# Patient Record
Sex: Male | Born: 1970 | Race: White | Hispanic: No | Marital: Married | Smoking: Never smoker
Health system: Southern US, Academic
[De-identification: ages and names within clinical notes are randomized; demographics above are authoritative.]

## PROBLEM LIST (undated history)

## (undated) DIAGNOSIS — R12 Heartburn: Secondary | ICD-10-CM

## (undated) DIAGNOSIS — L4050 Arthropathic psoriasis, unspecified: Secondary | ICD-10-CM

## (undated) HISTORY — PX: HX VASECTOMY: SHX75

## (undated) HISTORY — PX: WRIST SURGERY: SHX841

---

## 2001-04-13 ENCOUNTER — Emergency Department (HOSPITAL_COMMUNITY): Payer: Self-pay

## 2021-10-18 ENCOUNTER — Other Ambulatory Visit (HOSPITAL_COMMUNITY): Payer: Self-pay | Admitting: NURSE PRACTITIONER

## 2021-10-18 DIAGNOSIS — R109 Unspecified abdominal pain: Secondary | ICD-10-CM

## 2021-10-25 ENCOUNTER — Other Ambulatory Visit (HOSPITAL_COMMUNITY): Payer: Self-pay | Admitting: NURSE PRACTITIONER

## 2021-10-25 DIAGNOSIS — M25511 Pain in right shoulder: Secondary | ICD-10-CM

## 2021-12-30 ENCOUNTER — Ambulatory Visit (HOSPITAL_COMMUNITY): Payer: Self-pay

## 2022-01-17 ENCOUNTER — Other Ambulatory Visit (HOSPITAL_COMMUNITY): Payer: BC Managed Care – PPO

## 2022-02-28 ENCOUNTER — Ambulatory Visit (HOSPITAL_COMMUNITY): Payer: Self-pay

## 2022-04-06 IMAGING — MR MRI SHOULDER RT W/O CONTRAST
6 series · 39 of 40 positions shown · non-contrast
Comparison: None available.

﻿EXAM:  [id number]   MRI SHOULDER RT W/O CONTRAST
INDICATION: Right shoulder pain, limited range of motion.
TECHNIQUE: Noncontrast multiplanar, multisequence MRI was performed.

[Series 6: T1 · oblique · right · 3.5mm · 0.33mm/px · 7 of 18 slices shown]
[im 1/18]
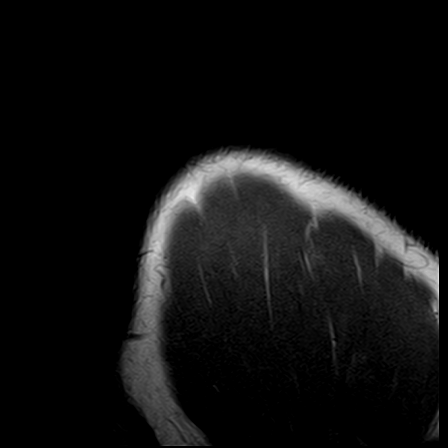
[im 3/18]
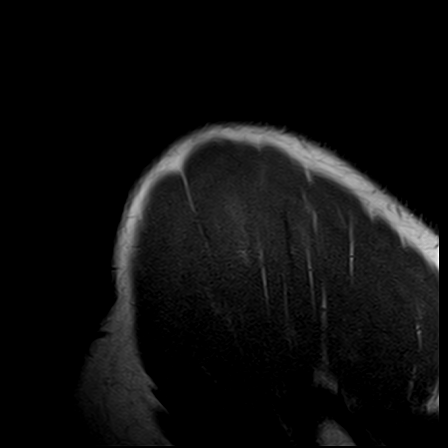
[im 6/18]
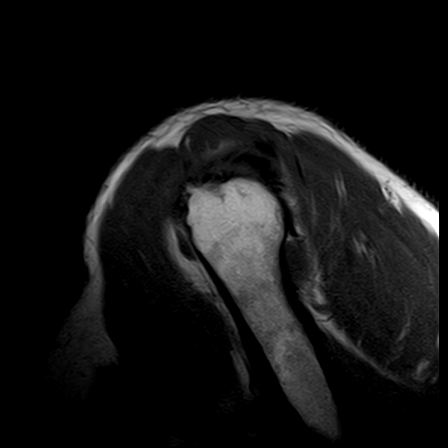
[im 9/18]
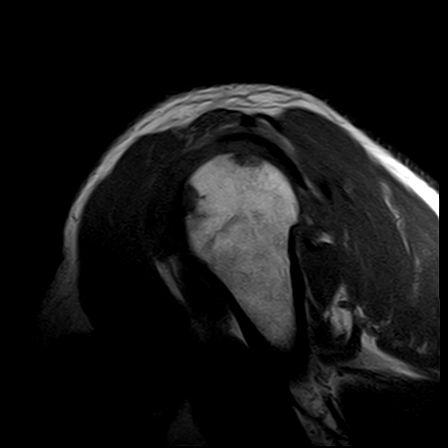
[im 12/18]
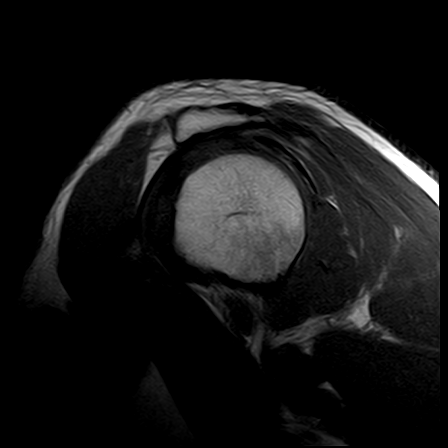
[im 15/18]
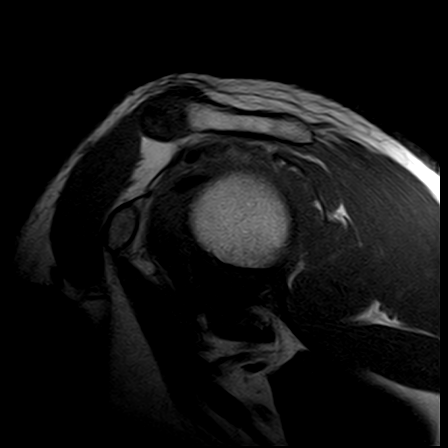
[im 18/18]
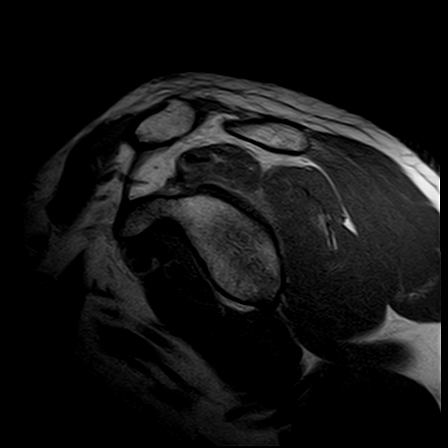

[Series 7: STIR · oblique · right · 3.5mm · 0.47mm/px · 7 of 18 slices shown (1 of 2)]
[im 1/18]
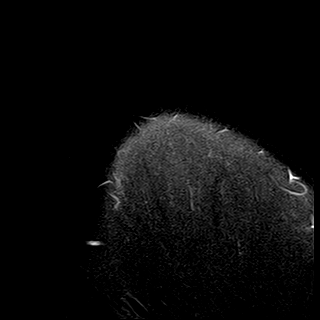
[im 3/18]
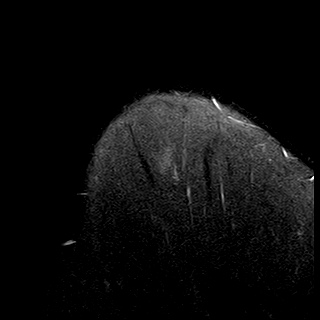
[im 6/18]
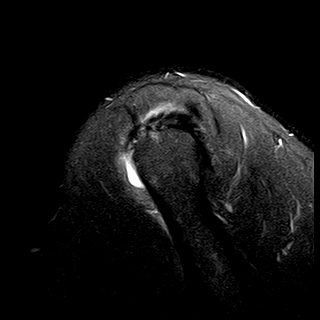
[im 9/18]
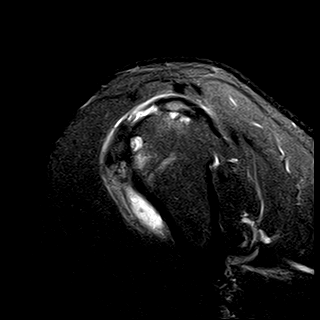
[im 12/18]
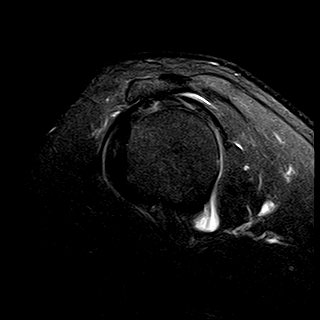
[im 15/18]
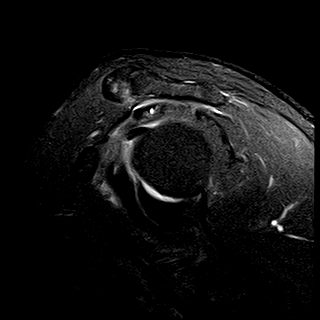
[im 18/18]
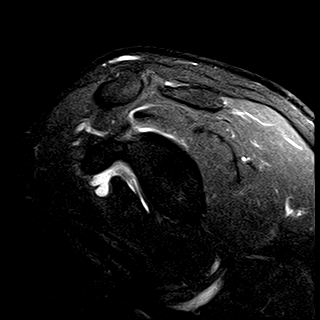

[Series 8: PD fat-sat · axial · right · 4.0mm · 0.50mm/px · z∈[+10,+86]mm · 6 of 18 slices shown (1 of 2)]
[im 1/18]
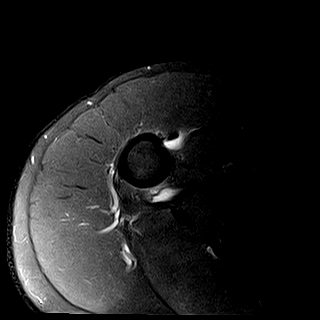
[im 4/18]
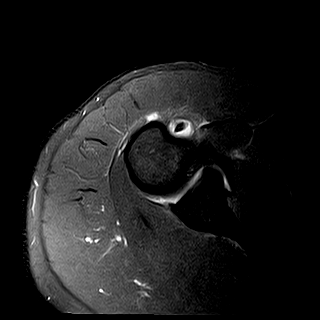
[im 7/18]
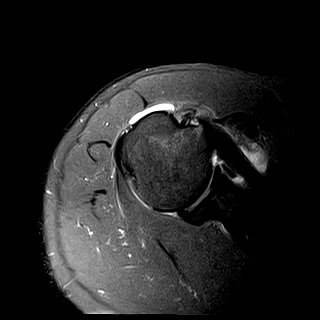
[im 11/18]
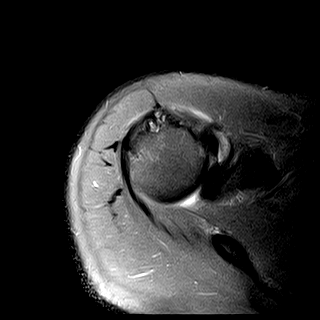
[im 14/18]
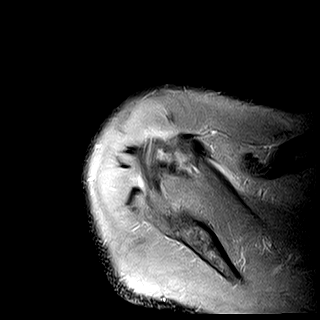
[im 18/18]
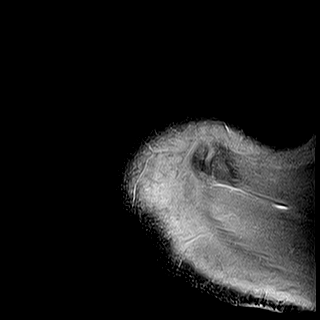

[Series 9: T2 fat-sat · axial · right · 4.0mm · 0.42mm/px · z∈[-3,+99]mm · 8 of 24 slices shown]
[im 1/24]
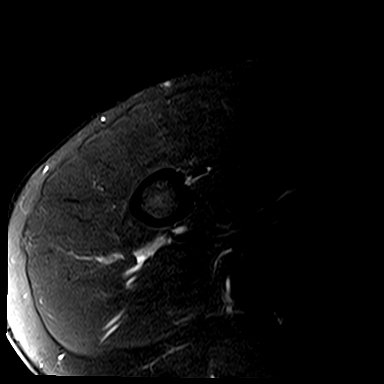
[im 4/24]
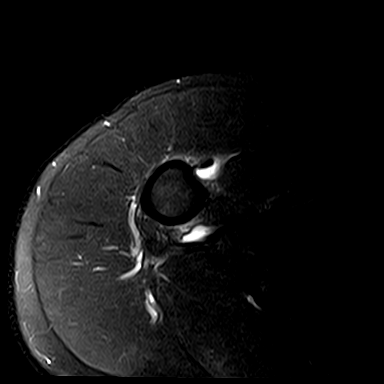
[im 7/24]
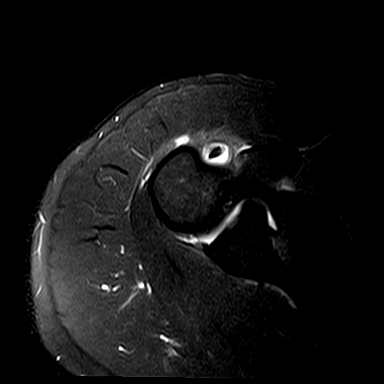
[im 10/24]
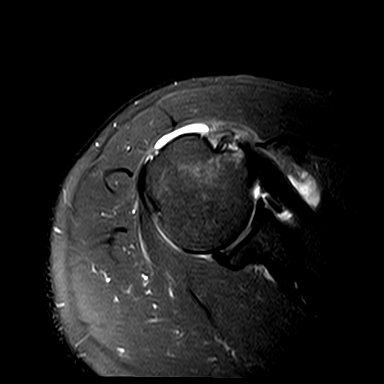
[im 14/24]
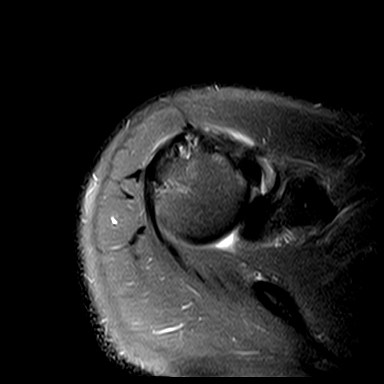
[im 17/24]
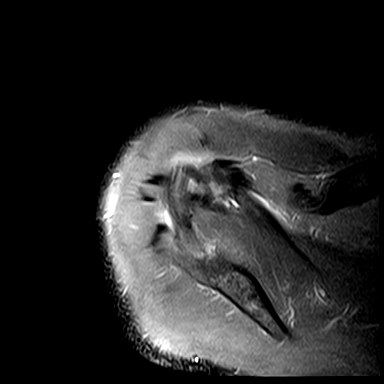
[im 20/24]
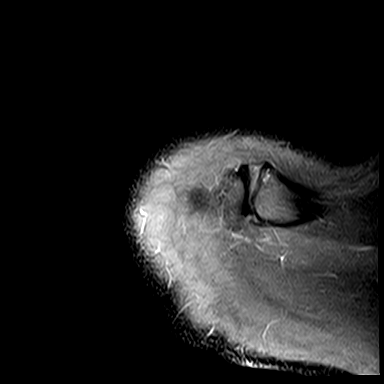
[im 24/24]
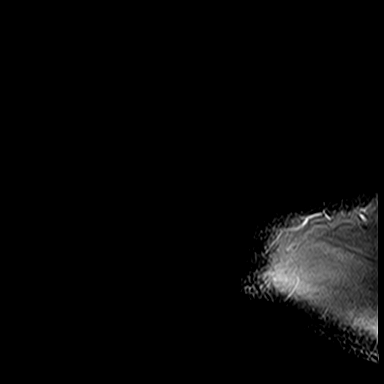

[Series 10: PD fat-sat · oblique · right · 3.5mm · 0.47mm/px · 6 of 18 slices shown (2 of 2)]
[im 1/18]
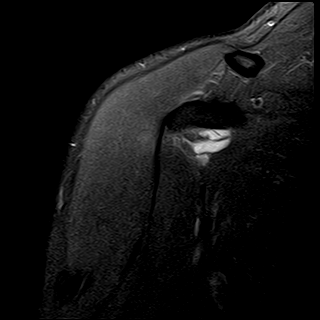
[im 4/18]
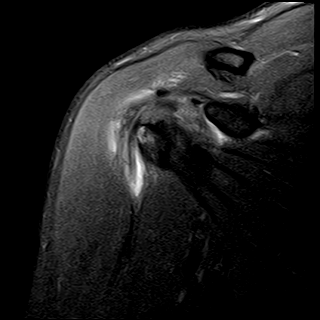
[im 7/18]
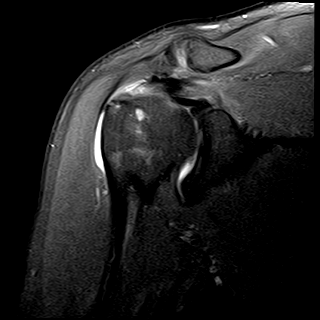
[im 11/18]
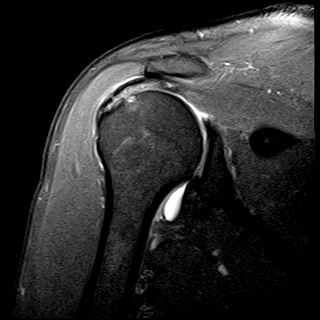
[im 14/18]
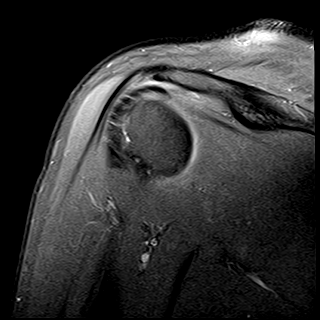
[im 18/18]
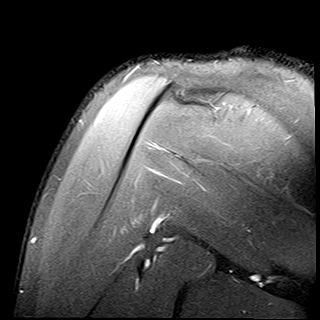

[Series 11: STIR · oblique · right · 3.5mm · 0.47mm/px · 5 of 18 slices shown (2 of 2)]
[im 1/18]
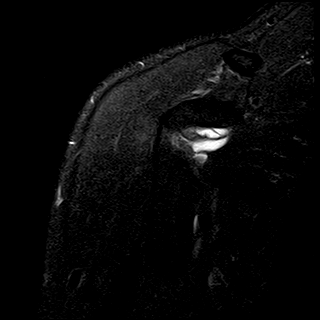
[im 4/18]
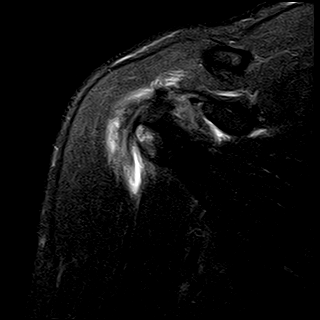
[im 7/18]
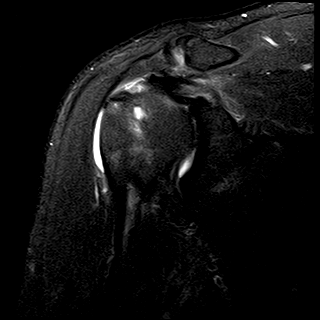
[im 11/18]
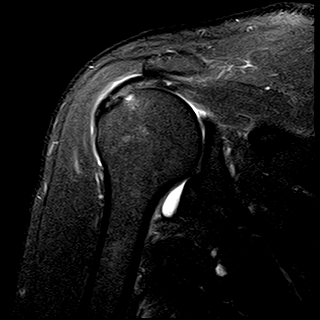
[im 14/18]
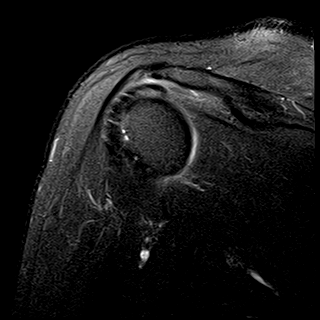

[39 of 40 positions shown; findings below may reference images not displayed]

FINDINGS: There is a small amount of fluid in the glenohumeral joint and in the subacromial-subdeltoid bursa.  Degenerative cysts are noted adjacent to the subscapularis and supraspinatus tendon insertions.  

There is a large partial tear involving the infraspinatus tendon.  There is also a full thickness tear of the supraspinatus tendon.  There is a partial tear of the subscapularis tendon.  

There are mild degenerative changes involving the acromioclavicular joint with a slightly down sloping acromion with impingement on the rotator cuff.  

The glenoid labrum appears intact.  There is no fracture or dislocation.  The biceps tendon is intact.
IMPRESSION: Rotator cuff tear.

## 2022-06-19 ENCOUNTER — Inpatient Hospital Stay (HOSPITAL_BASED_OUTPATIENT_CLINIC_OR_DEPARTMENT_OTHER)
Admission: RE | Admit: 2022-06-19 | Discharge: 2022-06-19 | Disposition: A | Discharge status: Home / Self Care | Payer: BC Managed Care – PPO | Source: Ambulatory Visit | Attending: Orthopaedic Surgery | Admitting: Orthopaedic Surgery

## 2022-06-19 ENCOUNTER — Other Ambulatory Visit (HOSPITAL_COMMUNITY): Payer: Self-pay | Admitting: Orthopaedic Surgery

## 2022-06-19 ENCOUNTER — Other Ambulatory Visit: Payer: BC Managed Care – PPO | Attending: Orthopaedic Surgery

## 2022-06-19 ENCOUNTER — Other Ambulatory Visit: Payer: Self-pay

## 2022-06-19 DIAGNOSIS — Z01818 Encounter for other preprocedural examination: Secondary | ICD-10-CM

## 2022-06-19 LAB — URINALYSIS, MACROSCOPIC
BILIRUBIN: NEGATIVE mg/dL
BLOOD: NEGATIVE mg/dL
GLUCOSE: NEGATIVE mg/dL
KETONES: NEGATIVE mg/dL
LEUKOCYTES: NEGATIVE WBCs/uL
NITRITE: NEGATIVE
PH: 5.5 (ref 5.0–9.0)
PROTEIN: NEGATIVE mg/dL
SPECIFIC GRAVITY: 1.009 (ref 1.002–1.030)
UROBILINOGEN: NORMAL mg/dL

## 2022-06-19 LAB — CBC WITH DIFF
BASOPHIL #: 0.1 10*3/uL (ref 0.00–0.10)
BASOPHIL %: 1 % (ref 0–1)
EOSINOPHIL #: 0.4 10*3/uL (ref 0.00–0.50)
EOSINOPHIL %: 5 %
HCT: 44 % (ref 36.7–47.1)
HGB: 15.4 g/dL (ref 12.5–16.3)
LYMPHOCYTE #: 2.9 10*3/uL (ref 1.00–3.00)
LYMPHOCYTE %: 35 % (ref 16–44)
MCH: 30.7 pg (ref 23.8–33.4)
MCHC: 35.1 g/dL (ref 32.5–36.3)
MCV: 87.6 fL (ref 73.0–96.2)
MONOCYTE #: 0.7 10*3/uL (ref 0.30–1.00)
MONOCYTE %: 9 % (ref 5–13)
MPV: 8.3 fL (ref 7.4–11.4)
NEUTROPHIL #: 4.3 10*3/uL (ref 1.85–7.80)
NEUTROPHIL %: 51 % (ref 43–77)
PLATELETS: 295 10*3/uL (ref 140–440)
RBC: 5.02 10*6/uL (ref 4.06–5.63)
RDW: 12.4 % (ref 12.1–16.2)
WBC: 8.4 10*3/uL (ref 3.6–10.2)

## 2022-06-19 LAB — BASIC METABOLIC PANEL
ANION GAP: 8 mmol/L (ref 4–13)
BUN/CREA RATIO: 13 (ref 6–22)
BUN: 11 mg/dL (ref 7–25)
CALCIUM: 9.6 mg/dL (ref 8.6–10.3)
CHLORIDE: 105 mmol/L (ref 98–107)
CO2 TOTAL: 26 mmol/L (ref 21–31)
CREATININE: 0.84 mg/dL (ref 0.60–1.30)
ESTIMATED GFR: 106 mL/min/{1.73_m2} (ref >59)
GLUCOSE: 76 mg/dL (ref 74–109)
OSMOLALITY, CALCULATED: 276 mOsm/kg (ref 270–290)
POTASSIUM: 3.8 mmol/L (ref 3.5–5.1)
SODIUM: 139 mmol/L (ref 136–145)

## 2022-06-19 LAB — ECG 12 LEAD
Atrial Rate: 70 {beats}/min
Calculated P Axis: 63 degrees
Calculated R Axis: 54 degrees
Calculated T Axis: 44 degrees
PR Interval: 160 ms
QRS Duration: 90 ms
QT Interval: 374 ms
QTC Calculation: 403 ms
Ventricular rate: 70 {beats}/min

## 2022-06-19 LAB — URINALYSIS, MICROSCOPIC: HYALINE CASTS: 1 /lpf — ABNORMAL HIGH (ref <0)

## 2022-07-04 ENCOUNTER — Ambulatory Visit (HOSPITAL_COMMUNITY): Payer: BC Managed Care – PPO | Admitting: Anesthesiology

## 2022-07-04 ENCOUNTER — Encounter (HOSPITAL_COMMUNITY): Payer: BC Managed Care – PPO | Admitting: Orthopaedic Surgery

## 2022-07-04 ENCOUNTER — Encounter (HOSPITAL_COMMUNITY): Payer: Self-pay | Admitting: Orthopaedic Surgery

## 2022-07-04 ENCOUNTER — Inpatient Hospital Stay
Admission: RE | Admit: 2022-07-04 | Discharge: 2022-07-04 | Disposition: A | Discharge status: Home / Self Care | Payer: BC Managed Care – PPO | Source: Ambulatory Visit | Attending: Orthopaedic Surgery | Admitting: Orthopaedic Surgery

## 2022-07-04 ENCOUNTER — Other Ambulatory Visit: Payer: Self-pay

## 2022-07-04 ENCOUNTER — Encounter (HOSPITAL_COMMUNITY)
Admission: RE | Disposition: A | Discharge status: Home / Self Care | Payer: Self-pay | Source: Ambulatory Visit | Attending: Orthopaedic Surgery

## 2022-07-04 DIAGNOSIS — K219 Gastro-esophageal reflux disease without esophagitis: Secondary | ICD-10-CM | POA: Insufficient documentation

## 2022-07-04 DIAGNOSIS — M75121 Complete rotator cuff tear or rupture of right shoulder, not specified as traumatic: Secondary | ICD-10-CM | POA: Insufficient documentation

## 2022-07-04 HISTORY — DX: Arthropathic psoriasis, unspecified (CMS HCC): L40.50

## 2022-07-04 HISTORY — DX: Heartburn: R12

## 2022-07-04 SURGERY — ARTHROSCOPY SHOULDER
Anesthesia: General | Site: Shoulder | Laterality: Right | Wound class: Clean Wound: Uninfected operative wounds in which no inflammation occurred

## 2022-07-04 MED ORDER — FAMOTIDINE (PF) 20 MG/2 ML INTRAVENOUS SOLUTION
INTRAVENOUS | Status: AC
Start: 2022-07-04 — End: 2022-07-04
  Filled 2022-07-04: qty 2

## 2022-07-04 MED ORDER — LACTATED RINGERS INTRAVENOUS SOLUTION
INTRAVENOUS | Status: DC
Start: 2022-07-04 — End: 2022-07-04

## 2022-07-04 MED ORDER — SODIUM CHLORIDE 0.9 % (FLUSH) INJECTION SYRINGE
3.0000 mL | INJECTION | Freq: Three times a day (TID) | INTRAMUSCULAR | Status: DC
Start: 2022-07-04 — End: 2022-07-04

## 2022-07-04 MED ORDER — MIDAZOLAM 5 MG/ML INJECTION WRAPPER
INTRAMUSCULAR | Status: AC
Start: 2022-07-04 — End: 2022-07-04
  Filled 2022-07-04: qty 1

## 2022-07-04 MED ORDER — SODIUM CHLORIDE 0.9 % INTRAVENOUS PIGGYBACK
INJECTION | INTRAVENOUS | Status: AC
Start: 2022-07-04 — End: 2022-07-04
  Filled 2022-07-04: qty 100

## 2022-07-04 MED ORDER — OXYCODONE-ACETAMINOPHEN 5 MG-325 MG TABLET
1.0000 | ORAL_TABLET | Freq: Once | ORAL | Status: DC | PRN
Start: 2022-07-04 — End: 2022-07-04

## 2022-07-04 MED ORDER — ONDANSETRON HCL (PF) 4 MG/2 ML INJECTION SOLUTION
4.0000 mg | Freq: Once | INTRAMUSCULAR | Status: AC
Start: 2022-07-04 — End: 2022-07-04
  Administered 2022-07-04: 4 mg via INTRAVENOUS

## 2022-07-04 MED ORDER — HYDROCODONE 7.5 MG-ACETAMINOPHEN 325 MG TABLET
1.0000 | ORAL_TABLET | ORAL | 0 refills | Status: AC | PRN
Start: 2022-07-04 — End: ?

## 2022-07-04 MED ORDER — FENTANYL (PF) 50 MCG/ML INJECTION SOLUTION
INTRAMUSCULAR | Status: AC
Start: 2022-07-04 — End: 2022-07-04
  Filled 2022-07-04: qty 2

## 2022-07-04 MED ORDER — EPHEDRINE SULFATE 50 MG/ML INTRAVENOUS SOLUTION
Freq: Once | INTRAVENOUS | Status: DC | PRN
Start: 2022-07-04 — End: 2022-07-04
  Administered 2022-07-04: 10 mg via INTRAVENOUS
  Administered 2022-07-04: 5 mg via INTRAVENOUS

## 2022-07-04 MED ORDER — SODIUM CHLORIDE 0.9 % (FLUSH) INJECTION SYRINGE
3.0000 mL | INJECTION | INTRAMUSCULAR | Status: DC | PRN
Start: 2022-07-04 — End: 2022-07-04

## 2022-07-04 MED ORDER — ROPIVACAINE (PF) 2 MG/ML (0.2 %) INJECTION SOLUTION
Freq: Once | INTRAMUSCULAR | Status: DC | PRN
Start: 2022-07-04 — End: 2022-07-04

## 2022-07-04 MED ORDER — PROCHLORPERAZINE EDISYLATE 10 MG/2 ML (5 MG/ML) INJECTION SOLUTION
5.0000 mg | Freq: Once | INTRAMUSCULAR | Status: DC | PRN
Start: 2022-07-04 — End: 2022-07-04

## 2022-07-04 MED ORDER — FENTANYL (PF) 50 MCG/ML INJECTION WRAPPER
50.0000 ug | INJECTION | INTRAMUSCULAR | Status: DC | PRN
Start: 2022-07-04 — End: 2022-07-04
  Administered 2022-07-04 (×2): 50 ug via INTRAVENOUS

## 2022-07-04 MED ORDER — LIDOCAINE (PF) 100 MG/5 ML (2 %) INTRAVENOUS SYRINGE
INJECTION | Freq: Once | INTRAVENOUS | Status: DC | PRN
Start: 2022-07-04 — End: 2022-07-04
  Administered 2022-07-04: 80 mg via INTRAVENOUS

## 2022-07-04 MED ORDER — OXYCODONE-ACETAMINOPHEN 5 MG-325 MG TABLET
1.0000 | ORAL_TABLET | Freq: Once | ORAL | Status: DC | PRN
Start: 2022-07-04 — End: 2022-07-04
  Administered 2022-07-04: 1 via ORAL
  Filled 2022-07-04: qty 1

## 2022-07-04 MED ORDER — FENTANYL (PF) 50 MCG/ML INJECTION WRAPPER
INJECTION | Freq: Once | INTRAMUSCULAR | Status: DC | PRN
Start: 2022-07-04 — End: 2022-07-04
  Administered 2022-07-04: 100 ug via INTRAVENOUS

## 2022-07-04 MED ORDER — SODIUM CHLORIDE 0.9 % (FLUSH) INJECTION SYRINGE
3.0000 mL | INJECTION | Freq: Three times a day (TID) | INTRAMUSCULAR | Status: DC
Start: 2022-07-04 — End: 2022-07-04
  Administered 2022-07-04: 3 mL

## 2022-07-04 MED ORDER — FENTANYL (PF) 50 MCG/ML INJECTION WRAPPER
25.0000 ug | INJECTION | INTRAMUSCULAR | Status: DC | PRN
Start: 2022-07-04 — End: 2022-07-04

## 2022-07-04 MED ORDER — ROPIVACAINE (PF) 2 MG/ML (0.2 %) INJECTION SOLUTION
INTRAMUSCULAR | Status: AC
Start: 2022-07-04 — End: 2022-07-04
  Filled 2022-07-04: qty 100

## 2022-07-04 MED ORDER — IPRATROPIUM 0.5 MG-ALBUTEROL 3 MG (2.5 MG BASE)/3 ML NEBULIZATION SOLN
3.0000 mL | INHALATION_SOLUTION | Freq: Once | RESPIRATORY_TRACT | Status: DC | PRN
Start: 2022-07-04 — End: 2022-07-04

## 2022-07-04 MED ORDER — PROPOFOL 10 MG/ML IV BOLUS
INJECTION | Freq: Once | INTRAVENOUS | Status: DC | PRN
Start: 2022-07-04 — End: 2022-07-04
  Administered 2022-07-04: 200 mg via INTRAVENOUS

## 2022-07-04 MED ORDER — GLYCOPYRROLATE 0.2 MG/ML INJECTION SOLUTION
Freq: Once | INTRAMUSCULAR | Status: DC | PRN
Start: 2022-07-04 — End: 2022-07-04
  Administered 2022-07-04: .6 mg via INTRAVENOUS

## 2022-07-04 MED ORDER — ONDANSETRON HCL (PF) 4 MG/2 ML INJECTION SOLUTION
INTRAMUSCULAR | Status: AC
Start: 2022-07-04 — End: 2022-07-04
  Filled 2022-07-04: qty 2

## 2022-07-04 MED ORDER — ONDANSETRON HCL (PF) 4 MG/2 ML INJECTION SOLUTION
4.0000 mg | Freq: Once | INTRAMUSCULAR | Status: DC | PRN
Start: 2022-07-04 — End: 2022-07-04

## 2022-07-04 MED ORDER — MORPHINE 10 MG/ML INJECTION WRAPPER
INTRAVENOUS | Status: AC
Start: 2022-07-04 — End: 2022-07-04
  Filled 2022-07-04: qty 2

## 2022-07-04 MED ORDER — HYDROMORPHONE 2 MG/ML INJECTION WRAPPER
1.0000 mg | INJECTION | Freq: Once | INTRAMUSCULAR | Status: DC | PRN
Start: 2022-07-04 — End: 2022-07-04
  Administered 2022-07-04: 1 mg via INTRAVENOUS
  Filled 2022-07-04: qty 1

## 2022-07-04 MED ORDER — ALBUTEROL SULFATE 2.5 MG/3 ML (0.083 %) SOLUTION FOR NEBULIZATION
2.5000 mg | INHALATION_SOLUTION | Freq: Once | RESPIRATORY_TRACT | Status: DC | PRN
Start: 2022-07-04 — End: 2022-07-04

## 2022-07-04 MED ORDER — MIDAZOLAM 5 MG/ML INJECTION WRAPPER
2.0000 mg | Freq: Once | INTRAMUSCULAR | Status: DC | PRN
Start: 2022-07-04 — End: 2022-07-04
  Administered 2022-07-04: 2 mg via INTRAVENOUS

## 2022-07-04 MED ORDER — HYDROMORPHONE 2 MG/ML INJECTION WRAPPER
1.0000 mg | INJECTION | Freq: Once | INTRAMUSCULAR | Status: DC | PRN
Start: 2022-07-04 — End: 2022-07-04

## 2022-07-04 MED ORDER — FAMOTIDINE (PF) 20 MG/2 ML INTRAVENOUS SOLUTION
20.0000 mg | Freq: Once | INTRAVENOUS | Status: AC
Start: 2022-07-04 — End: 2022-07-04
  Administered 2022-07-04: 20 mg via INTRAVENOUS

## 2022-07-04 MED ORDER — DEXAMETHASONE SODIUM PHOSPHATE 4 MG/ML INJECTION SOLUTION
4.0000 mg | Freq: Once | INTRAMUSCULAR | Status: AC
Start: 2022-07-04 — End: 2022-07-04
  Administered 2022-07-04: 4 mg via INTRAVENOUS

## 2022-07-04 MED ORDER — SODIUM CHLORIDE 0.9 % INTRAVENOUS PIGGYBACK
2.0000 g | INJECTION | Freq: Once | INTRAVENOUS | Status: AC
Start: 2022-07-04 — End: 2022-07-04
  Administered 2022-07-04 (×2): 2 g via INTRAVENOUS

## 2022-07-04 MED ORDER — ROPIVACAINE (PF) 2 MG/ML (0.2 %) INJECTION SOLUTION
INTRAMUSCULAR | Status: AC
Start: 2022-07-04 — End: 2022-07-04
  Filled 2022-07-04: qty 20

## 2022-07-04 MED ORDER — ROCURONIUM 10 MG/ML INTRAVENOUS SOLUTION
Freq: Once | INTRAVENOUS | Status: DC | PRN
Start: 2022-07-04 — End: 2022-07-04
  Administered 2022-07-04: 50 mg via INTRAVENOUS

## 2022-07-04 MED ORDER — NEOSTIGMINE METHYLSULFATE 1 MG/ML INTRAVENOUS SOLUTION
Freq: Once | INTRAVENOUS | Status: DC | PRN
Start: 2022-07-04 — End: 2022-07-04
  Administered 2022-07-04: 3 mg via INTRAVENOUS

## 2022-07-04 MED ORDER — MORPHINE 10 MG/ML INJECTION WRAPPER
Freq: Once | INTRAVENOUS | Status: DC | PRN
Start: 2022-07-04 — End: 2022-07-04

## 2022-07-04 MED ORDER — HYDROMORPHONE 2 MG/ML INJECTION WRAPPER
INJECTION | Freq: Once | INTRAMUSCULAR | Status: DC | PRN
Start: 2022-07-04 — End: 2022-07-04
  Administered 2022-07-04: .6 mg via INTRAVENOUS

## 2022-07-04 MED ORDER — DEXAMETHASONE SODIUM PHOSPHATE 4 MG/ML INJECTION SOLUTION
INTRAMUSCULAR | Status: AC
Start: 2022-07-04 — End: 2022-07-04
  Filled 2022-07-04: qty 1

## 2022-07-04 MED ORDER — DEXMEDETOMIDINE 100 MCG/ML INTRAVENOUS SOLUTION
Freq: Once | INTRAVENOUS | Status: DC | PRN
Start: 2022-07-04 — End: 2022-07-04
  Administered 2022-07-04 (×5): 4 ug via INTRAVENOUS

## 2022-07-04 MED ORDER — CEFAZOLIN 1 GRAM SOLUTION FOR INJECTION
INTRAMUSCULAR | Status: AC
Start: 2022-07-04 — End: 2022-07-04
  Filled 2022-07-04: qty 20

## 2022-07-04 SURGICAL SUPPLY — 55 items
ANCHOR SUT 2.6MM 1.7MM FIBERTAK FIBERTAPE SUTURETAPE SLF PNCH 1.3MM BLK BLU IMPLANT
ANCHOR SUT 2.6MM 1.7MM FIBERTAK TIGERTAPE SUTURETAPE SLF PNCH 1.3MM BLU WHT ×1 IMPLANT
ANCHOR SUT 2.9MM SHORT PUSHLOCK CANN EYLT HNDL INSERT BIOCOMP 12.5MM STRL DISP IMPLANT
ANCHOR SUT 4.75MM SWIVELOCK C TIGERTAPE CLS EYLT VENT LOOP BIOCOMP PEEK 19.1MM STRL DISP BLK WHT IMPLANT
ANCHOR SUT ARTHX FIBERTAK STRL LF  DISP IMPLANT
ANCHOR SUT SWIVELOCK C CLOSE EYLT TAPE LOOP VENT BIOCOMP 19.1MM STRL DISP BLU FIBERTAPE IMPLANT
ANCHOR SUTURE 24.5X4.75MM SWIVELOCK BLUE BIOCOMPOS SELF PUNCH KNOTLESS STRL ×1 IMPLANT
BANDAGE COFLX 5YDX4IN NONST CHSV SLF ADH FOAM COMPRESS TAN LF (WOUND CARE SUPPLY) ×1 IMPLANT
BLADE 11 2 END CBNSTL SURG STRL DISP (SURGICAL CUTTING SUPPLIES) ×1 IMPLANT
BLADE SHAVER 13CM 4MM EXCLBR C_OOLCUT STRL DISP (ENDOSCOPIC SUPPLIES) ×1 IMPLANT
BLADE SHAVER 13CM 5.5MM EXCLBR COOLCUT STRL DISP (ENDOSCOPIC SUPPLIES) IMPLANT
BURR SHAVER 13CM 4MM COOLCUT 8 FLUTE OVAL STRL DISP (ENDOSCOPIC SUPPLIES) ×1 IMPLANT
CANNULA ARTHRO 8.25MM 7CM TWIST-IN SHLDR OBTURATOR THREAD TRANSLUC STRL DISP (ENDOSCOPIC SUPPLIES) ×1 IMPLANT
COUNTER 20 CNT BLOCK ADH NEEDLE STRL LF  RD SHARP FOAM 15.75X11.5X14IN DISP (MED SURG SUPPLIES) ×1 IMPLANT
COVER 53X24IN MAYOSTAND PRXM STRL DISP EQP SMS LF (DRAPE/PACKS/SHEETS/OR TOWEL) ×2 IMPLANT
COVER TBL 90X50IN STD SMS REINF FNFLD STRL LF  DISP (DRAPE/PACKS/SHEETS/OR TOWEL) ×3 IMPLANT
DETERGENT INSTR 22OZ TRNSPT GEL RINSE FREE NEUT PH PREKLENZ CLR PLSNT LF (MISCELLANEOUS PT CARE ITEMS) ×1 IMPLANT
DISCONTINUED NO SUB - DRESS TRNSPR 4.5X4IN FILM IV STRL LF (WOUND CARE SUPPLY) IMPLANT
DRAPE 2 INCS FILM ANTIMIC 23X17IN IOBN STRL SURG (DRAPE/PACKS/SHEETS/OR TOWEL) ×1 IMPLANT
DRAPE FNFLD ABS REINF 77X53IN 43528 PRXM LF  STRL DISP SURG SMS 44X23IN (DRAPE/PACKS/SHEETS/OR TOWEL) ×4 IMPLANT
DRAPE INCS ANTIMIC 23X23IN IOBN2 TRNSPR (DRAPE/PACKS/SHEETS/OR TOWEL) ×1 IMPLANT
DRESS TRNSPR 4.5X4IN FILM IV STRL LF (WOUND CARE/ENTEROSTOMAL SUPPLY)
DRUG DEL ONQ 2.5IN 26.5IN PUMP KIT ANTIMIC CATH DEHP 2ML/HR SYSTEM 100ML STRL DISP (MED SURG SUPPLIES) ×1 IMPLANT
FIX N TACK SWIVELOCK FIBERLINK SUTURETAPE BIOCOMP SYSTEM TENODESIS IMPLANT
GLOVE SURG 7.5 LF  PF BEAD CUF STRL CRM 11.8IN PROTEXIS PI PLISPRN THK9.1 MIL (GLOVES AND ACCESSORIES) ×1 IMPLANT
GLOVE SURG 7.5 LF  PF SMOOTH BEAD CUF INTLK STRL BLU 11.8IN PROTEXIS NEU-THERA PLISPRN THK7.9 MIL (GLOVES AND ACCESSORIES) ×2 IMPLANT
GLOVE SURG 7.5 LTX PF SMOOTH BEAD CUF STRL YW 12IN PROTEXIS (GLOVES AND ACCESSORIES) ×1 IMPLANT
GLOVE SURG 8 LF  PF SMOOTH BEAD CUF INTLK STRL BLU 11.8IN PROTEXIS NEU-THERA PLISPRN THK7.9 MIL (GLOVES AND ACCESSORIES) ×1 IMPLANT
GLOVE SURG 8 LTX PF SMOOTH BEAD CUF STRL YW 12IN PROTEXIS NEU-THERA DDRGL THK8.7 MIL (GLOVES AND ACCESSORIES) ×1 IMPLANT
GOWN SURG LRG STD LGTH L3 HKLP CLSR RGLN SLEEVE TWL STRL LF  DISP GRN AERO BLU PRFRM FBRC (DRAPE/PACKS/SHEETS/OR TOWEL) ×1 IMPLANT
GOWN SURG XL STD LGTH L3 HKLP CLSR RGLN SLEEVE TWL STRL LF  DISP GRN AERO BLU PRFRM FBRC (DRAPE/PACKS/SHEETS/OR TOWEL) ×3 IMPLANT
INSTR ORTHO 1.8MM SHAVER DRILL FLXB OBTURATOR (SURGICAL INSTRUMENTS) IMPLANT
LABEL MED CORRECT MED LABELING SYS 4 FLG 2 SHEET 24 PRPRNT STRL (MED SURG SUPPLIES) ×1 IMPLANT
MAT INSTR TRY 44X36IN WTPRF BACKSHEET TPNX BLU (MISCELLANEOUS PT CARE ITEMS) ×3 IMPLANT
NEEDLE HYPO  18GA 1.5IN REG WL BD PRCSNGL POLYPROP REG BVL LL HUB CLR CD DEHP-FR STRL LF  DISP (MED SURG SUPPLIES) ×1 IMPLANT
NEEDLE SPINAL PNK 3.5IN 18GA QUINCKE REG WL POLYPROP QUINCKE TIP STRL LF  DISP (MED SURG SUPPLIES) ×1 IMPLANT
NEEDLE SUT MEGALOADER HD SCORPION (SUTURE/WOUND CLOSURE) ×1 IMPLANT
NEEDLE SUT SCORPION SUREFIRE ROTR CUF (SUTURE/WOUND CLOSURE) IMPLANT
PACK SURG ECLIPSE SHLDR PCH STRL DISP 114X77IN 100X66IN LF (DRAPE/PACKS/SHEETS/OR TOWEL) ×1 IMPLANT
PAD ABDOMINAL 8X7.5IN LF  STRL (WOUND CARE SUPPLY) IMPLANT
PASSER SUTLASSO SD 90D UP STR 1.8MM 3.8MM SHLDR THB PAD SUT NITINOL LABRAL ASCP ROTR CUF REPR STRL (SUTURE/WOUND CLOSURE) IMPLANT
PROBE ESURG APOLLORF I90 90 DEG ASPIRATE ABLATOR (MED SURG SUPPLIES) ×1 IMPLANT
PUMP TUBING 13FT CONT WV III DUALWAVE ARTHRO STRL DISP (MED SURG SUPPLIES) ×1 IMPLANT
SET TUBING DUALWAVE CASSETTE OFLW ASCP PUMP STRL DISP (ENDOSCOPIC SUPPLIES) ×1 IMPLANT
SLING ORTHO LRG 17.5X8.5IN ARM SHLDR PAD ENV BRTHBL HKLP (ORTHOPEDICS (NOT IMPLANTS)) ×1 IMPLANT
SOL IRRG LR 3L PRSV N-PYRG FLXB CONTAINR STRL LF (MEDICATIONS/SOLUTIONS) ×3 IMPLANT
SOL SURG PREP 26ML DRPRP 74% ISPRP 0.7% IOD POVACRYLEX SLF CNTN APPL SKIN STRL PREOP (MED SURG SUPPLIES) ×1 IMPLANT
SPONGE GAUZE 4X4IN MDCHC COTTON 12 PLY TY 7 LF  STRL DISP (WOUND CARE SUPPLY) IMPLANT
SPONGE LAP 18X18IN PREWASH RIGID TRY STRL LF  WHT (MED SURG SUPPLIES) ×1 IMPLANT
SUTURE 4-0 FS1 PROLENE 18IN BLU MONOF NONAB (SUTURE/WOUND CLOSURE) ×2 IMPLANT
SYRINGE LL 10ML LF  STRL GRAD N-PYRG DEHP-FR PVC FREE MED DISP (MED SURG SUPPLIES) ×1 IMPLANT
TOWEL 24X16IN COTTON BLU DISP SURG STRL LF (DRAPE/PACKS/SHEETS/OR TOWEL) ×4 IMPLANT
TUBING IRRG 6FT DUALWAVE BKFL CK VALVE EXT STRL DISP (ENDOSCOPIC SUPPLIES) ×1 IMPLANT
TUBING SUCT CLR 12FT .25IN ARGYLE PVC NCDTV STR MALE FEMALE MLD CONN STRL LF (MED SURG SUPPLIES) ×3 IMPLANT
TUBING SUCT CLR 6FT .25IN ARGYLE PVC NCDTV STR MALE FEMALE MLD CONN STRL LF (MED SURG SUPPLIES) ×1 IMPLANT

## 2022-07-04 NOTE — Interval H&P Note (Signed)
H & P updated the day of the procedure.  1.  H&P completed within 30 days of surgical procedure and has been reviewed within 24 hours of admission but prior to surgery or a procedure requiring anesthesia services, the patient has been examined, and no change has occured in the patients condition since the H&P was completed.       Change in medications: No        No LMP recorded.      Comments:     2.  Patient continues to be appropriate candidate for planned surgical procedure. YES    Carlos Hidalgo, DO

## 2022-07-04 NOTE — OR Surgeon (Signed)
[facility]   Operative Note   PATIENT NAME:  Carlos Parker, Carlos Parker  MRN:  [id number]  DOB:  02/28/1970    Date of Procedure:  07/04/2022  Preoperative Diagnosis: RIGHT ROTATOR CUFF TEAR   Postoperative Diagnoses:  RIGHT ROTATOR CUFF TEAR   Procedure Performed: Procedure(s) (LRB):  ARTHROSCOPIC SUBACROMIAL DECOMPRESSION RIGHT SHOULDER, ARTHROSCOPIC ROTATOR CUFF REPAIR RIGHT SHOULDER, PLACEMENT OF ON-Q PAIN PUMP (Right)    Surgeon: Quenton Fetter, DO   Anesthesia: General  Estimated Blood Loss: Minimal  Complications: None immediate  Description of Procedure patient taken to the operating room given a general anesthetic placed in the left lateral decubitus position with the right shoulder up appropriate padding and traction placed the right shorter prepped with DuraPrep draped in a sterile manner.  Posterior portal utilized glenohumeral joint revealed an intact inferior recess labrum and biceps tendon articular surface is well-maintained.  A full-thickness rotator cuff tear noted from the joint side.  Attention to the subacromial space, bursa resected through the lateral portal, subacromial decompression carried out by releasing the CA ligament with electrocautery anterior acromioplasty with the bur back to a flat anterior edge.  Full-thickness rotator cuff tear was noted the edge was debrided we proceeded with a single row repair.  A medial anchor was placed sutures divided past and pulled to a lateral anchor with secure fixation.  Viewing and probing through 2 portals verified a secure repair.  Shoulder lavaged on Q pain catheter inserted injected with Naropin portals closed with 5 0 Prolene suture in an interrupted fashion.  Sterile bandage applied with sling patient rolled supine awoken from anesthesia and brought to recovery in stable condition.  Quenton Fetter, DO   This note was partially generated using MModal Fluency Direct system, and there may be some incorrect words, spellings, and punctuation  that were not noted in checking the note before saving.

## 2022-07-04 NOTE — Anesthesia Postprocedure Evaluation (Signed)
Anesthesia Post Op Evaluation    Patient: Carlos Parker  Procedure(s):  ARTHROSCOPIC SUBACROMIAL DECOMPRESSION RIGHT SHOULDER, ARTHROSCOPIC ROTATOR CUFF REPAIR RIGHT SHOULDER, PLACEMENT OF ON-Q PAIN PUMP    Last Vitals:Temperature: 36.1 C (97 F) (07/04/22 1352)  Heart Rate: 75 (07/04/22 1352)  BP (Non-Invasive): (!) 166/89 (07/04/22 1347)  Respiratory Rate: 16 (07/04/22 1352)  SpO2: 98 % (07/04/22 1352)    There were no known notable events for this encounter.    Patient is sufficiently recovered from the effects of anesthesia to participate in the evaluation and has returned to their pre-procedure level.  Patient location during evaluation: PACU       Patient participation: complete - patient participated  Level of consciousness: awake and alert and responsive to verbal stimuli    Pain management: adequate  Airway patency: patent    Anesthetic complications: no  Cardiovascular status: acceptable  Respiratory status: acceptable  Hydration status: acceptable  Patient post-procedure temperature: Pt Normothermic   PONV Status: Absent

## 2022-07-04 NOTE — Anesthesia Preprocedure Evaluation (Signed)
ANESTHESIA PRE-OP EVALUATION  Planned Procedure: RIGHT SHOULDER SUBACROMIAL  DECOMPRESSION ROTATOR CUFF REPAIR (Right: Shoulder)  Review of Systems    family history of anesthetic complications (mother PONV)       patient summary reviewed  nursing notes reviewed        Pulmonary  negative pulmonary ROS,    Cardiovascular  negative cardio ROS,   ECG reviewed ,No peripheral edema,  Exercise Tolerance: > or = 4 METS        GI/Hepatic/Renal    GERD and well controlled        Endo/Other   neg endo/other ROS,       Neuro/Psych/MS   negative neuro/psych ROS,      Cancer    negative hematology/oncology ROS,                     Physical Assessment      Airway       Mallampati: III    TM distance: 3 FB    Neck ROM: full  Mouth Opening: fair.  Facial hair  No Beard        Dental           (+) caps           Pulmonary    Breath sounds clear to auscultation  (-) no rhonchi, no decreased breath sounds, no wheezes, no rales and no stridor     Cardiovascular    Rhythm: regular  Rate: Normal  (-) no friction rub, carotid bruit is not present, no peripheral edema and no murmur     Other findings              Plan  ASA 2     Planned anesthesia type: general     general anesthesia with endotracheal tube intubation      PONV Plan:  I plan to administer pharmcologic prophalaxis antiemetics  POV PLAN:   plan for postoperative opioid use            Intravenous induction     Anesthesia issues/risks discussed are: Dental Injuries, Eye /Visual Loss, Nerve Injuries, PONV, Stroke, Aspiration, Difficult Airway, Cardiac Events/MI, Intraoperative Awareness/ Recall, Blood Loss and Sore Throat.  Anesthetic plan and risks discussed with patient  signed consent obtained          Patient's NPO status is appropriate for Anesthesia.           Plan discussed with CRNA.    (Use glidescope)

## 2022-07-04 NOTE — H&P (Signed)
Paper H and P on chart, will be scanned to EMR.

## 2022-07-04 NOTE — OR Nursing (Signed)
Arm sling applied to Rt arm immed. Postop.

## 2022-07-04 NOTE — Discharge Instructions (Addendum)
Follow up with Dr Lequita Halt on July 17, 2022 @ 9:40.    Remove post op dressing to operative shoulder when On Q pain pump is empty.  The pain pump will infuse into the shoulder on its own and will go in within 2-3 days.  After the pain pump is empty take the gauze off and remove the clear op site dressing and gently pull the tubing of the pump out.  You will see a white line on the tubing when you reach the end of the tubing.  Throw tubing and pump in the garbage.    Start daily showers after the pain pump is removed.  Sponge bath until the pain pump comes out.    You can put band aids on your incisions until they scab over.    Wear the sling at all times even when sleeping to allow the tendons to heal.    You can use ice on the operative shoulder.  20 minutes on, 20 minutes off. Make sure to use a barrier between ice and skin.     Call for problems, questions, concerns.    Resume home meds.

## 2022-07-04 NOTE — Anesthesia Transfer of Care (Signed)
ANESTHESIA TRANSFER OF CARE   Carlos Parker is a 52 y.o. ,male, Weight: 86.2 kg (190 lb)   had Procedure(s):  ARTHROSCOPIC SUBACROMIAL DECOMPRESSION RIGHT SHOULDER, ARTHROSCOPIC ROTATOR CUFF REPAIR RIGHT SHOULDER, PLACEMENT OF ON-Q PAIN PUMP  performed  07/04/22   Primary Service: Quenton Fetter, DO    Past Medical History:   Diagnosis Date   . Heartburn    . Psoriatic arthritis (CMS HCC)       Allergy History as of 07/04/22       PROCHLORPERAZINE         Noted Status Severity Type Reaction    07/04/22 0830 Delight Hoh, RN 07/04/22 Active    Other Adverse Reaction (Add comment)                  I completed my transfer of care / handoff to the receiving personnel during which we discussed:  All key/critical aspects of case discussed, Analgesia, Fluids/Product, Gave opportunity for questions and acknowledgement of understanding and PMHx                              Additional Info:Drowsy, breathing easily, vss, no c/o pain, MAE,follows commands, report to pacu RN                                   Last OR Temp: Temperature: 36.1 C (97 F)  ABG:  POTASSIUM   Date Value Ref Range Status   06/19/2022 3.8 3.5 - 5.1 mmol/L Final     KETONES   Date Value Ref Range Status   06/19/2022 Negative Negative, Trace mg/dL Final     CALCIUM   Date Value Ref Range Status   06/19/2022 9.6 8.6 - 10.3 mg/dL Final     Calculated P Axis   Date Value Ref Range Status   06/19/2022 63 degrees Final     Calculated R Axis   Date Value Ref Range Status   06/19/2022 54 degrees Final     Calculated T Axis   Date Value Ref Range Status   06/19/2022 44 degrees Final     Airway:  EndoTracheal Tube (Active)     Blood pressure (!) 156/75, pulse 75, temperature 36.1 C (97 F), resp. rate (!) 10, height 1.803 m (5\' 11" ), weight 86.2 kg (190 lb), SpO2 97%.

## 2022-07-04 NOTE — OR PostOp (Signed)
Patient to be discharged home with family. AVS reviewed with patient, son, and spouse, Misty. A written copy was given. Questions sufficiently answered as needed. Patient encouraged to follow up with surgeon as indicated. In the event of an emergency, instructed to call 911 or go to the nearest emergency room.
